# Patient Record
Sex: Female | Born: 1960 | Hispanic: Yes | Marital: Married | Smoking: Never smoker
Health system: Southern US, Community
[De-identification: ages and names within clinical notes are randomized; demographics above are authoritative.]

## PROBLEM LIST (undated history)

## (undated) DIAGNOSIS — E119 Type 2 diabetes mellitus without complications: Secondary | ICD-10-CM

## (undated) HISTORY — PX: ABDOMINAL HYSTERECTOMY: SHX81

## (undated) HISTORY — DX: Type 2 diabetes mellitus without complications: E11.9

---

## 2015-05-15 ENCOUNTER — Ambulatory Visit: Payer: Worker's Compensation

## 2015-05-15 ENCOUNTER — Ambulatory Visit (INDEPENDENT_AMBULATORY_CARE_PROVIDER_SITE_OTHER): Payer: Worker's Compensation | Admitting: Urgent Care

## 2015-05-15 VITALS — BP 120/68 | HR 71 | Temp 98.4°F | Resp 16 | Ht 62.0 in | Wt 118.0 lb

## 2015-05-15 DIAGNOSIS — S4991XA Unspecified injury of right shoulder and upper arm, initial encounter: Secondary | ICD-10-CM

## 2015-05-15 DIAGNOSIS — M25511 Pain in right shoulder: Secondary | ICD-10-CM

## 2015-05-15 DIAGNOSIS — M542 Cervicalgia: Secondary | ICD-10-CM | POA: Diagnosis not present

## 2015-05-15 MED ORDER — CYCLOBENZAPRINE HCL 5 MG PO TABS: 5.0000 mg | ORAL_TABLET | Freq: Every day | ORAL | Status: AC

## 2015-05-15 MED ORDER — TRAMADOL HCL 50 MG PO TABS: 25.0000 mg | ORAL_TABLET | Freq: Three times a day (TID) | ORAL | Status: DC | PRN

## 2015-05-15 NOTE — Patient Instructions (Addendum)
Because you received an x-ray today, you will receive an invoice from [facility]. Please contact [facility] at [phone] with questions or concerns regarding your invoice. Our billing staff will not be able to assist you with those questions.    Dolor en el hombro (Shoulder Pain) El hombro es la articulacin que une los brazos al cuerpo. Los [employer] que forman la articulacin del hombro son el hueso del brazo (hmero), el omplato (escpula) y Curator. La parte superior del hmero es similar a una bola y Chartered certified accountant en una cavidad ms bien plana en la escpula (cavidad glenoidea). Una combinacin de msculos y tejidos fuertes y fibrosos que [employer] msculos a los huesos (tendones) soportan la articulacin del hombro y Engineer, materials la bola en la cavidad. En diferentes zonas de la articulacin hay pequeas bolsas llenas de lquido (bursa). Actan como amortiguadores [employer] y los tejidos blandos que recubren y [place] a reducir la friccin [employer] tendones y el hueso al mover el brazo. La articulacin del hombro permite una amplia gama de movimientos del brazo. Este rango de movimientos permite hacer diferentes cosas,como rascarse la espalda o lanzar una pelota. Sin embargo, esta amplitud de movimientos del hombro tambin lo hace ms propenso al dolor por uso excesivo y por lesiones. Las causas de dolor en el hombro pueden originarse tanto en las lesiones como en el uso excesivo y se pueden agrupar en las siguientes cuatro categoras:  Enrojecimiento, hinchazn y dolor (inflamacin) del tendn (tendinitis) o la bursa (bursitis).  Inestabilidad, como en la luxacin de la articulacin.  Inflamacin de la articulacin (artritis).  Un hueso roto Customer service manager). INSTRUCCIONES PARA EL CUIDADO EN EL HOGAR   Aplique hielo sobre la zona dolorida.  Ponga el hielo en una bolsa plstica.  Colquese una toalla entre la piel y la bolsa de hielo.  Deje el hielo durante 15 a  , 3 a 4veces por [employer] [address], o segn las indicaciones del mdico.  Deje de usar compresas fras si no Tourist information centre manager.  Si tiene un cabestrillo o inmovilizador de hombro, llvelo del modo en que su mdico le indique. Solo debe quitrselo para ducharse o baarse. Mueva el brazo lo menos posible, pero mantenga la mano en movimiento para evitar la hinchazn.  Apriete una pelota blanda o una almohadilla de goma todo lo posible para evitar la hinchazn.  Utilice los medicamentos de venta libre o recetados para Primary school teacher, Environmental health practitioner o la fiebre, segn se lo indique el mdico. SOLICITE ATENCIN MDICA SI:   El dolor en el hombro aumenta o siente un dolor nuevo en el brazo, la mano o los dedos.  La mano o los dedos estn fros y adormecidos.  El dolor no se alivia con los [employer]. SOLICITE ATENCIN MDICA DE INMEDIATO SI:   El brazo, la mano o los dedos estn adormecidos o siente hormigueos.  El brazo, la mano o los dedos estn muy hinchados o se ven blancos o azules. ASEGRESE DE QUE:   Comprende estas instrucciones.  Controlar su afeccin.  Recibir ayuda de inmediato si no mejora o si empeora.   Esta informacin no tiene Theme park manager el consejo del mdico. Asegrese de hacerle al mdico cualquier pregunta que tenga.   Document Released: 12/11/2004 Document Revised: 03/24/2014 Elsevier Interactive Patient Education [employer]

## 2015-05-15 NOTE — Progress Notes (Signed)
    MRN: [id number] DOB: [birth year]  Subjective:   Anna Daniel is a 55 y.o. female presenting for chief complaint of Neck Pain and Shoulder Pain  Reports that while at work 05/03/2015, she suffered a fall onto metal object and made impact with the back of her head and right shoulder. She did not fall, was able to regain her balance and has since had neck pain, severe right shoulder pain. She had some swelling over the back of her head, had neck stiffness. She has been using Tylenol with some relief except in the past few days. Has also used Voltaren gel with some relief. 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   Lani's medications list, allergies, past medical history and past surgical history were reviewed and excluded from this note due to being a worker's comp case.  Objective:   Vitals: BP 120/68 mmHg  Pulse 71  Temp(Src) 98.4 F (36.9 C) (Oral)  Resp 16  Ht  (1.575 m)  Wt 118 lb (53.524 kg)  BMI 21.58 kg/m2  SpO2 98%  Physical Exam  Constitutional: She is oriented to person, place, and time. She appears well-developed and well-nourished.  Cardiovascular: Normal rate.   Pulmonary/Chest: Effort normal.  Musculoskeletal:       Right shoulder: She exhibits decreased range of motion (abduction, external rotation), tenderness and bony tenderness (over AC joint and deltoid). She exhibits no swelling, no effusion, no crepitus, no deformity, no laceration, no spasm and normal strength.       Cervical back: She exhibits tenderness (right sided) and spasm. She exhibits normal range of motion, no bony tenderness, no swelling, no edema, no deformity and no laceration.       Back:  Neurological: She is alert and oriented to person, place, and time. She has normal reflexes. No cranial nerve deficit. Coordination normal.  Skin: Skin is warm and dry.   Dg Cervical  Spine Complete  05/15/2015  CLINICAL DATA:  Neck pain, post fall EXAM: CERVICAL SPINE - COMPLETE 4+ VIEW COMPARISON:  None. FINDINGS: Degenerative disc disease at C4-5 and C5-6 with disc space narrowing and spurring. Normal alignment. Prevertebral soft tissues are unremarkable. No fracture. IMPRESSION: Degenerative disc disease changes as above.  No acute findings. Electronically Signed   By: Charlett Nose M.D.   On: 05/15/2015 15:32   Assessment and Plan :   1. Neck pain on right side 2. Right shoulder pain 3. Right shoulder injury, initial encounter - X-ray findings reassuring, advised conservative management. Work restrictions provided. Use Flexeril and Tramadol as needed given allergies to aspirin and difficulty tolerating NSAIDs. RTC in 2 weeks if no improvement. Also, put in an order for referral to PT, if denied refer to ortho. Patient is aware.  Wallis Bamberg, PA-C [facility] [phone] 05/15/2015 2:58 PM

## 2015-05-29 ENCOUNTER — Ambulatory Visit (INDEPENDENT_AMBULATORY_CARE_PROVIDER_SITE_OTHER): Payer: Worker's Compensation | Admitting: Family Medicine

## 2015-05-29 VITALS — BP 115/68 | HR 73 | Temp 98.1°F | Resp 16 | Ht 61.5 in | Wt 114.0 lb

## 2015-05-29 DIAGNOSIS — M542 Cervicalgia: Secondary | ICD-10-CM | POA: Diagnosis not present

## 2015-05-29 DIAGNOSIS — M25511 Pain in right shoulder: Secondary | ICD-10-CM

## 2015-05-29 NOTE — Patient Instructions (Addendum)
IF you received an x-ray today, you will receive an [facility] from [facility]. Please contact [facility] at [phone] with questions or concerns regarding your invoice.   IF you received labwork today, you will receive an invoice from [facility]. Please contact [facility] at [phone] with questions or concerns regarding your invoice.   Our billing staff will not be able to assist you with questions regarding bills from these companies.  You will be contacted with the lab results as soon as they are available. The fastest way to get your results is to activate your My Chart account. Instructions are located on the last page of this paperwork. If you have not heard from us regarding the results in 2 weeks, please contact this office.  No mas tramadol.  Tylenol cada 4-6 horas si necesario. Cyclobenzaprine una pastilla en la noche (es posible cada 8 horas, pero causa sueno). voy a referir a Office managerspecialista de hueso.  regrese mas temprano si empeorse.

## 2015-05-29 NOTE — Progress Notes (Signed)
Subjective:  This chart was scribed for Meredith StaggersJeffrey Eddith Mentor MD, by Veverly FellsHatice Demirci,scribe, at [facility].  This patient was seen in room  4 and the patient's care was started at 9:00 AM.   Chief Complaint  Patient presents with  . Shoulder Pain  . Neck Pain     Patient ID: Anna Daniel, female    DOB: 01/21/1961, 55 y.o.   MRN: [id number]  HPI HPI Comments: Anna Daniel is a 55 y.o. female who presents to the [facility] complaining of neck and shoulder pain. Patient was seen Feb 28th with right shoulder pain and right neck pain after she fell at work on Feb 16th impacting the back of her head and right shoulder.  She had a c spine x ray which showed degenerative disc disease but no acute findings. She was treated with Flexeril andTramadol.  She was not given NSAID due to her history of difficulty tolerating these and her allergies to aspirin. Work restrictions and referral to PT was given.    Patient states that her shoulder and neck pain is much worse now and feels like she is losing her strength for the past 7-10 days. She stopped taking her medications which she was prescribed as they gave her abdominal pain but she is unsure of which ones caused this pain.   She states that she took two doses of Flexeril at night.  She has not yet gone to physical therapy as she did not receive a call for an appointment. She is not currently on any other medications.   Her manager was okay with her following the restrictions which were given to her by Gurney Maxin(Mike Mani PA) but states that her supervisor told her she must work.    There are no active problems to display for this patient.  Past Medical History  Diagnosis Date  . Diabetes mellitus without complication [facility](HCC)    Past Surgical History  Procedure Laterality Date  . Cesarean section    . Abdominal hysterectomy     Allergies  Allergen Reactions  . Aspirin Hives   Prior to Admission medications     Medication Sig Start Date End Date Taking? Authorizing Provider  metFORMIN (GLUCOPHAGE) 500 MG tablet Take by mouth 2 (two) times daily with a meal.   Yes Historical Provider, MD  cyclobenzaprine (FLEXERIL) 5 MG tablet Take 1-2 tablets (5-10 mg total) by mouth at bedtime. Patient not taking: Reported on 05/29/2015 05/15/15   Wallis BambergMario Mani, PA-C  traMADol (ULTRAM) 50 MG tablet Take 0.5-1 tablets (25-50 mg total) by mouth every 8 (eight) hours as needed. Patient not taking: Reported on 05/29/2015 05/15/15   Wallis BambergMario Mani, PA-C   Social History   Social History  . Marital Status: Married    Spouse Name: N/A  . Number of Children: N/A  . Years of Education: N/A   Occupational History  . Not on file.   Social History Main Topics  . Smoking status: Never Smoker   . Smokeless tobacco: Not on file  . Alcohol Use: Not on file  . Drug Use: Not on file  . Sexual Activity: Not on file   Other Topics Concern  . Not on file   Social History Narrative       Review of Systems  Constitutional: Negative for fever and chills.  Eyes: Negative for pain, redness and itching.  Respiratory: Negative for cough, choking and shortness of breath.   Gastrointestinal: Negative for  nausea and vomiting.  Musculoskeletal: Positive for myalgias. Negative for neck pain and neck stiffness.       Objective:   Physical Exam  Constitutional: She appears well-developed and well-nourished. No distress.  HENT:  Head: Normocephalic and atraumatic.  Eyes: Pupils are equal, round, and reactive to light.  Neck:  c spine no midline bony tenderness She is tender along the right paraspinal muscles and right trapezius.  Some discomfort with flexion of the neck Guarded with extension.  45 deg of rotation bilaterally.  guarded with minimal lateral flexion bilaterally.  Cardiovascular: Normal rate, regular rhythm and normal heart sounds.  Exam reveals no friction rub.   No murmur heard. Pulmonary/Chest: Effort normal  and breath sounds normal. No respiratory distress. She has no wheezes. She has no rales.  Musculoskeletal:  Equal grip strength but slight decrease strength  minimal right resisted flexion and extension at the elbow. Pain with empty can testing but did not feel weak compared to the other side.  Equal rotator cuff strength.  Equal ROM at the right shoulder.  Neurological:  Reflex Scores:      Tricep reflexes are 2+ on the right side and 2+ on the left side.      Bicep reflexes are 2+ on the right side and 2+ on the left side.      Brachioradialis reflexes are 2+ on the right side and 2+ on the left side. Skin: Skin is warm and dry.  Psychiatric: She has a normal mood and affect. Her behavior is normal.   Filed Vitals:   05/29/15 0844  BP: 115/68  Pulse: 73  Temp: 98.1 F (36.7 C)  Resp: 16  Height: 5' 1.5" (1.562 m)  Weight: 114 lb (51.71 kg)       Assessment & Plan:  Anna Daniel is a 55 y.o. female Pain in joint of right shoulder - Plan: Ambulatory referral to Orthopedic Surgery  Neck pain on right side - Plan: Ambulatory referral to Orthopedic Surgery  Pain in neck/shoulder due to injury at work as above. Now with subjective weakness. Reflexes ok, but with interval worsening - will refer to ortho for further eval.  In meantime - can try lower dose of flexeril at night, tylenol otc, and work restrictions. rtc precautions given prior to ortho eval.   No orders of the defined types were placed in this encounter.   Patient Instructions  IF you received an x-ray today, you will receive an invoice from [facility]. Please contact [facility] at [phone] with questions or concerns regarding your invoice.   IF you received labwork today, you will receive an invoice from [employer]. Please contact [facility] at [phone] with questions or concerns regarding your invoice.   Our billing staff will not be able to assist you with  questions regarding bills from these companies.  You will be contacted with the lab results as soon as they are available. The fastest way to get your results is to activate your My Chart account. Instructions are located on the last page of this paperwork. If you have not heard from [place] regarding the results in 2 weeks, please contact this office.  No mas tramadol.  Tylenol cada 4-6 horas si necesario. Cyclobenzaprine una pastilla en la noche (es posible cada 8 horas, pero causa sueno). voy a referir a Office manager.  regrese mas temprano si empeorse.        I personally performed the services described in this documentation,  which was scribed in my presence. The recorded information has been reviewed and considered, and addended by me as needed.

## 2015-07-18 ENCOUNTER — Telehealth: Payer: Self-pay

## 2015-07-18 NOTE — Telephone Encounter (Signed)
[facility] faxed orders for Dr Neva SeatGreene to review/sign. Put in Dr Paralee CancelGreene's box.

## 2015-07-19 ENCOUNTER — Other Ambulatory Visit: Payer: Worker's Compensation

## 2015-07-19 NOTE — Telephone Encounter (Signed)
Noted. I did place the previous orders and recent notes from PT in the past box. Understand that she started to be seen today, but this was not authorized under workers comp.  Please call her. I recommend follow-up with any provider if still symptomatic for further evaluation, and possibly orthopedic evaluation as recommended previously.

## 2015-07-20 NOTE — Telephone Encounter (Signed)
I gave pt the message and she seemed to understand, but then asked if we have someone she can talk to who speaks spanish. I called an interpreter and called pt back. Pt stated that her company is not paying for anything related to the accident she had at work over 2 mos ago. She has seen an attorney who is trying to arrange for her to see an ortho. Dr Neva SeatGreene, [place] ChildesFYI.

## 2017-04-05 IMAGING — CR DG CERVICAL SPINE COMPLETE 4+V
6 series · 6 of 6 positions shown · non-contrast
Comparison: None.

CLINICAL DATA: Neck pain, post fall

EXAM:
CERVICAL SPINE - COMPLETE 4+ VIEW

[lateral]
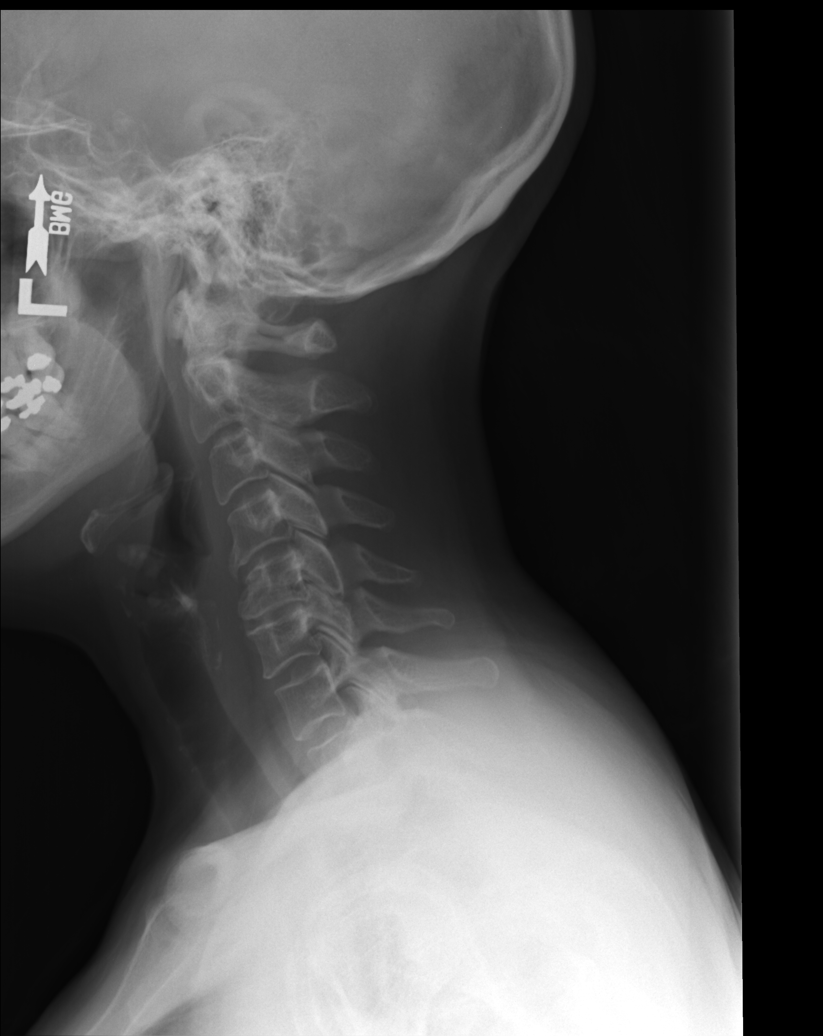

[ap open mouth]
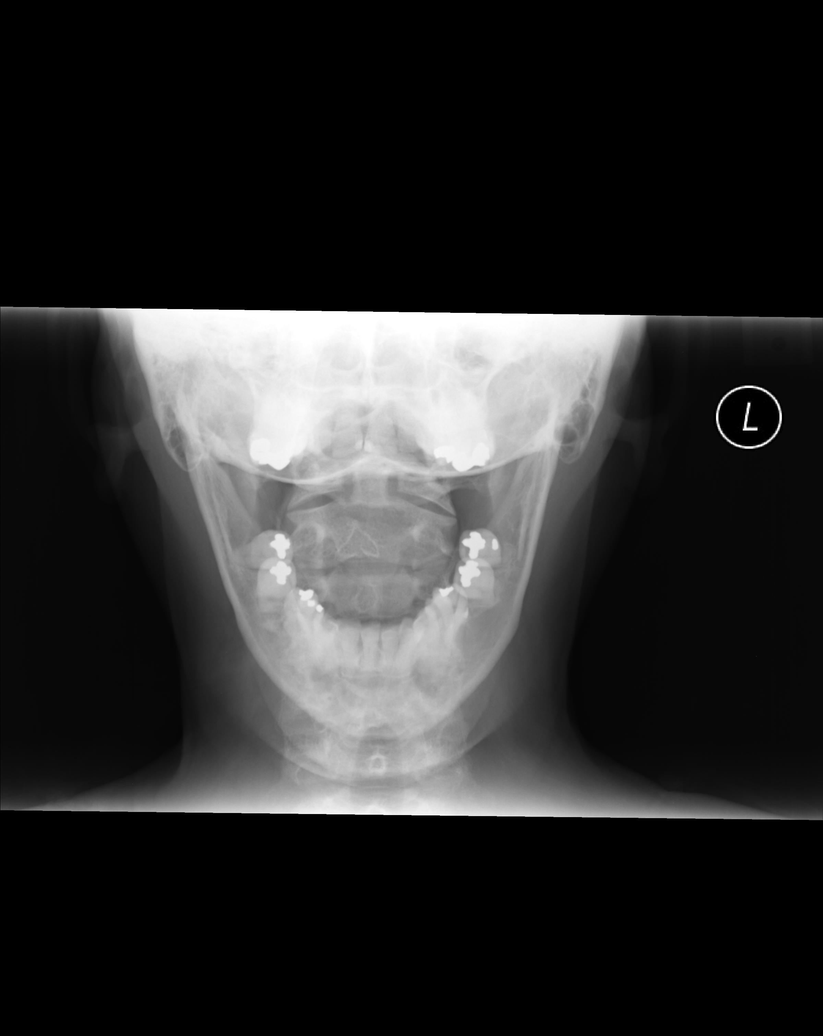

[AP]
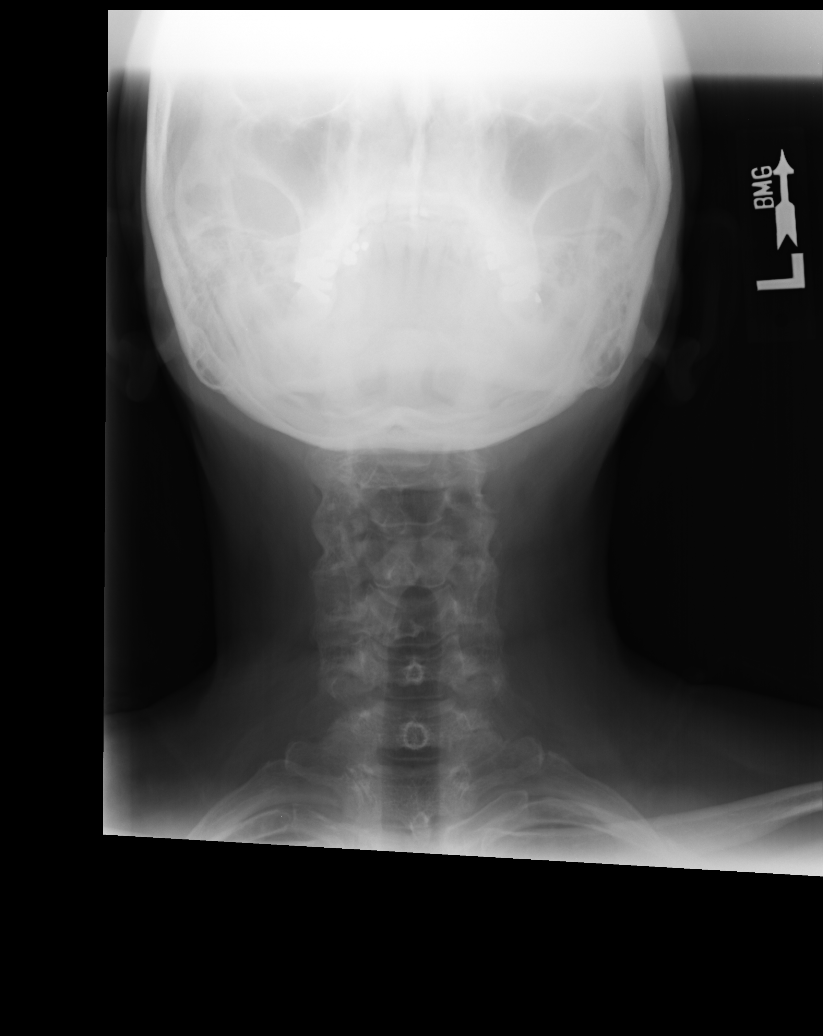

[swimmers]
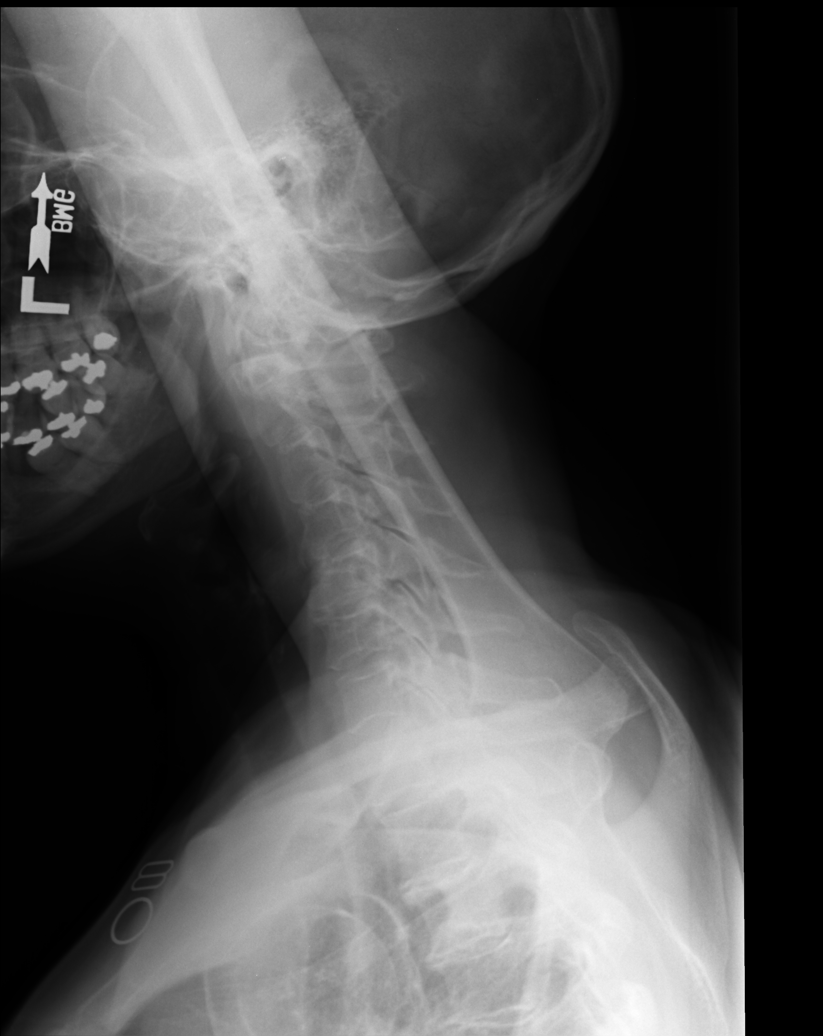

[lpo]
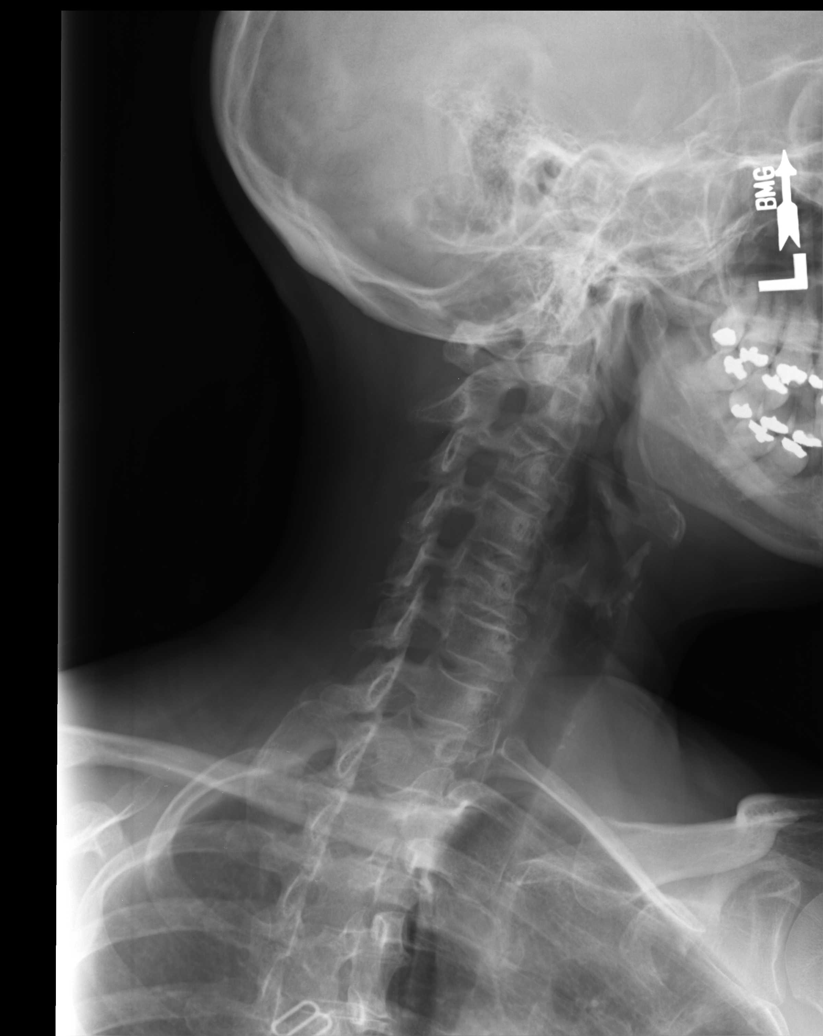

[rpo]
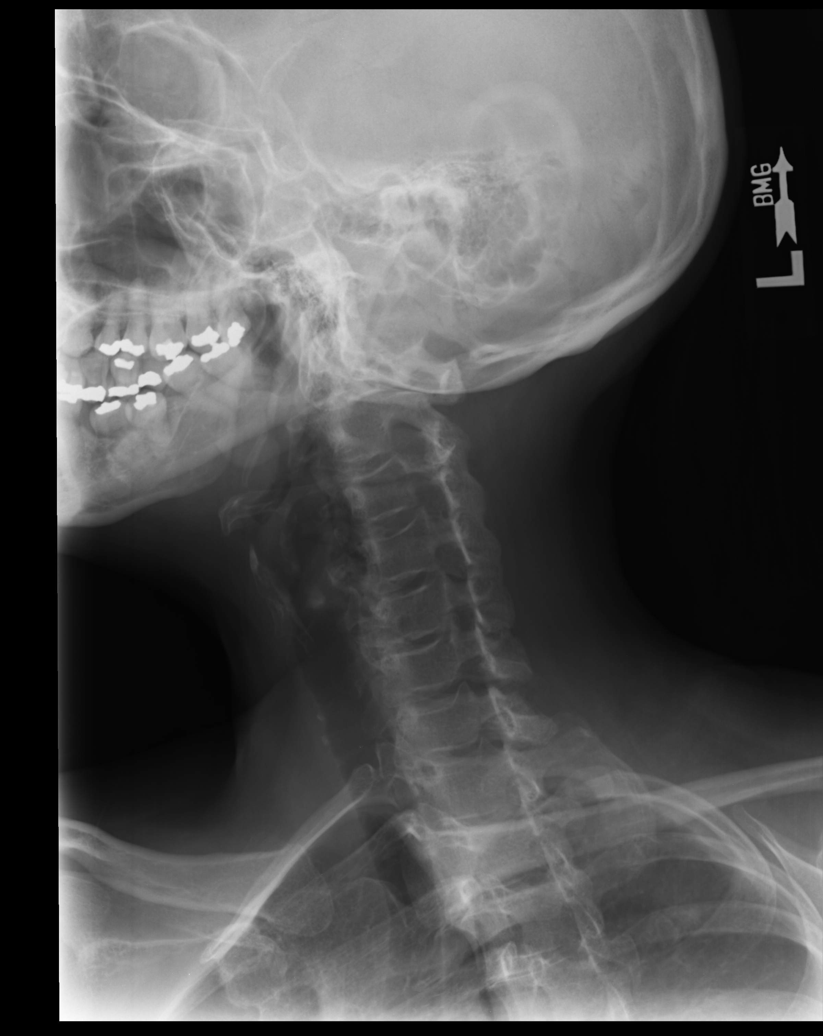

[6 of 6 positions shown; findings below may reference images not displayed]

FINDINGS: Degenerative disc disease at C4-5 and C5-6 with disc space narrowing
and spurring. Normal alignment. Prevertebral soft tissues are
unremarkable. No fracture.
IMPRESSION: Degenerative disc disease changes as above.  No acute findings.

## 2020-09-27 ENCOUNTER — Ambulatory Visit (INDEPENDENT_AMBULATORY_CARE_PROVIDER_SITE_OTHER): Payer: Self-pay | Admitting: Obstetrics and Gynecology

## 2020-09-27 ENCOUNTER — Other Ambulatory Visit (HOSPITAL_COMMUNITY)
Admission: RE | Admit: 2020-09-27 | Discharge: 2020-09-27 | Disposition: A | Discharge status: Home / Self Care | Payer: Self-pay | Source: Ambulatory Visit | Attending: Obstetrics and Gynecology | Admitting: Obstetrics and Gynecology

## 2020-09-27 ENCOUNTER — Encounter: Payer: Self-pay | Admitting: Obstetrics and Gynecology

## 2020-09-27 ENCOUNTER — Other Ambulatory Visit: Payer: Self-pay

## 2020-09-27 DIAGNOSIS — N898 Other specified noninflammatory disorders of vagina: Secondary | ICD-10-CM

## 2020-09-27 DIAGNOSIS — N941 Unspecified dyspareunia: Secondary | ICD-10-CM | POA: Insufficient documentation

## 2020-09-27 DIAGNOSIS — Z113 Encounter for screening for infections with a predominantly sexual mode of transmission: Secondary | ICD-10-CM

## 2020-09-27 DIAGNOSIS — N949 Unspecified condition associated with female genital organs and menstrual cycle: Secondary | ICD-10-CM

## 2020-09-27 DIAGNOSIS — E119 Type 2 diabetes mellitus without complications: Secondary | ICD-10-CM

## 2020-09-27 NOTE — Progress Notes (Signed)
Patient complains of vaginal pain and burinng during sex. Stated that it started about 3 months ago.

## 2020-09-27 NOTE — Progress Notes (Signed)
GYNECOLOGY OFFICE VISIT NOTE  History:  60 y.o. G3P0 here today for concern of dyspareunia and vaginal discomfort for 3 months. Pt reports that she has never had these symptoms before. The pain is most noticeable upon penile entry, not with deep penetration. No new sexual partners. No report of trauma or recent stress in relationship. Denies abnormal skin lesions or abnormal vaginal discharge. No prior known STIs, yeast infections or BV, but pt did recently receive a prescription for macrobid from an urgent care clinic for diagnosed UTI. She was also prescribed clindamycin cream, which she has used with minimal benefit. Pt has a history of complete hysterectomy but believes she still has her ovaries and tubes. No history of GU cancer. No postmenopausal bleeding. Of note pt has a diagnosis of diabetes, well-controlled on metformin but no other known health conditions.  Past Medical History:  Diagnosis Date   Diabetes mellitus without complication (HCC)     Past Surgical History:  Procedure Laterality Date   ABDOMINAL HYSTERECTOMY     CESAREAN SECTION       Current Outpatient Medications:    ezetimibe (ZETIA) 10 MG tablet, Take 10 mg by mouth at bedtime., Disp: , Rfl:    metFORMIN (GLUCOPHAGE) 500 MG tablet, Take by mouth 2 (two) times daily with a meal., Disp: , Rfl:    cyclobenzaprine (FLEXERIL) 5 MG tablet, Take 1-2 tablets (5-10 mg total) by mouth at bedtime. (Patient not taking: No sig reported), Disp: 30 tablet, Rfl: 1  The following portions of the patient's history were reviewed and updated as appropriate: allergies, current medications, past family history, past medical history, past social history, past surgical history and problem list.   Health Maintenance:  Last pap: recent per pt report; no history of abnormal results Last mammogram: recent per pt report  Review of Systems:  Pertinent items noted in HPI and remainder of comprehensive ROS otherwise negative.   Objective:   Physical Exam There were no vitals taken for this visit. CONSTITUTIONAL: Well-developed, well-nourished female in no acute distress.  HENT:  Normocephalic, atraumatic. External right and left ear normal. Moist mucous membranes. EYES: Conjunctivae and EOM are normal.  NECK: Normal range of motion, supple, no masses SKIN: Skin is warm and dry. No rash noted. Not diaphoretic. No erythema. No pallor. NEUROLOGIC: Alert and oriented to person, place, and time. No cranial nerve deficit noted. PSYCHIATRIC: Normal mood and affect. Normal behavior. Normal judgment and thought content. CARDIOVASCULAR: Normal heart rate noted RESPIRATORY: Normal WOB ABDOMEN: Soft, no distention noted.   PELVIC: Normal appearing external genitalia with secondary erythema or lesions; normal appearing vaginal mucosa and cervical cuff with minimal physiologic discharge. Pelvic cultures obtained. Negative Q tip test and no cervical motion tenderness with digital exam. MUSCULOSKELETAL: Normal range of motion. No edema noted.  Exam done with chaperone present.  Labs and Imaging No results found.  Assessment & Plan:  1. Dyspareunia in female  Vaginal itching: Pt reports 3 months of sudden onset vaginal pain without known trigger. Pt reports pain is worse with initial penile entry but negative Q tip test. No report of relationship stressors. No urinary symptoms suggestive of UTI and negative UA in clinic today. No evidence of contact dermatitis on exam and no report of known triggers. - f/u cervicovaginal swabs to rule out infectious etiology (reassuringly pt with normal pelvic exam) - recommended use of water-based lubricant (will defer premarin given no signs of vaginal atrophy) - recommended use of sensitive soap and avoidance of  shaving, douching  - RTC prn pending results as noted above  2. Screen for sexually transmitted diseases: No prior h/o STIs and no recent partners but pt agreeable to screening today given  symptoms as noted above. - f/u screen for GCC, HIV, RPR  3. Type 2 diabetes mellitus without complication, without long-term current use of insulin (HCC): Pt reports taking metformin with good adherence. - f/u A1c today given no recent testing  Routine preventative health maintenance measures emphasized. Please refer to After Visit Summary for other counseling recommendations.   No follow-ups on file.  Sheila Oats, MD OB Fellow, Faculty Practice

## 2020-09-27 NOTE — Patient Instructions (Signed)
K-Y Jelly o Water Based Lubricant

## 2020-09-28 LAB — RPR: RPR Ser Ql: NONREACTIVE

## 2020-09-28 LAB — CERVICOVAGINAL ANCILLARY ONLY
Bacterial Vaginitis (gardnerella): NEGATIVE
Candida Glabrata: NEGATIVE
Candida Vaginitis: NEGATIVE
Chlamydia: NEGATIVE
Comment: NEGATIVE
Comment: NEGATIVE
Comment: NEGATIVE
Comment: NEGATIVE
Comment: NEGATIVE
Comment: NORMAL
Neisseria Gonorrhea: NEGATIVE
Trichomonas: NEGATIVE

## 2020-09-28 LAB — HEMOGLOBIN A1C
Est. average glucose Bld gHb Est-mCnc: 134 mg/dL
Hgb A1c MFr Bld: 6.3 % — ABNORMAL HIGH (ref 4.8–5.6)

## 2020-09-28 LAB — HIV ANTIBODY (ROUTINE TESTING W REFLEX): HIV Screen 4th Generation wRfx: NONREACTIVE

## 2020-10-01 ENCOUNTER — Telehealth: Payer: Self-pay | Admitting: Obstetrics and Gynecology

## 2020-10-01 NOTE — Telephone Encounter (Signed)
Called and spoke with pt regarding recent lab results in clinic. Given no sign of infection discussed that contact dermatitis or possible hormone changes with age may be contributing to pt's symptoms. Encouraged pt to try water based lubrication to minimize discomfort. No prior findings on exam suggestive of need for premarin but may consider if symptoms persist. Pt in agreement with plan and will call clinic if concern of persistent or worsening symptoms.  Sheila Oats, MD OB Fellow, Faculty Practice 10/01/2020 5:43 PM

## 2021-09-11 ENCOUNTER — Other Ambulatory Visit: Payer: Self-pay | Admitting: *Deleted

## 2021-09-11 DIAGNOSIS — Z1231 Encounter for screening mammogram for malignant neoplasm of breast: Secondary | ICD-10-CM

## 2021-09-19 ENCOUNTER — Ambulatory Visit: Payer: Self-pay

## 2021-12-03 ENCOUNTER — Ambulatory Visit: Payer: Self-pay | Admitting: Internal Medicine
